# Patient Record
Sex: Male | Born: 1990 | Hispanic: Yes
Health system: Southern US, Community
[De-identification: ages and names within clinical notes are randomized; demographics above are authoritative.]

## PROBLEM LIST (undated history)

## (undated) HISTORY — PX: KNEE SURGERY: SHX244

---

## 2020-03-24 ENCOUNTER — Other Ambulatory Visit: Payer: Self-pay

## 2020-03-24 ENCOUNTER — Ambulatory Visit (HOSPITAL_COMMUNITY)
Admission: RE | Admit: 2020-03-24 | Discharge: 2020-03-24 | Disposition: A | Discharge status: Home / Self Care | Payer: Self-pay | Source: Ambulatory Visit | Attending: Orthopedic Surgery | Admitting: Orthopedic Surgery

## 2020-03-24 ENCOUNTER — Other Ambulatory Visit (HOSPITAL_COMMUNITY): Payer: Self-pay | Admitting: Orthopedic Surgery

## 2020-03-24 DIAGNOSIS — Z9889 Other specified postprocedural states: Secondary | ICD-10-CM

## 2020-03-24 DIAGNOSIS — M79662 Pain in left lower leg: Secondary | ICD-10-CM

## 2020-03-24 DIAGNOSIS — M7989 Other specified soft tissue disorders: Secondary | ICD-10-CM | POA: Insufficient documentation

## 2020-12-22 ENCOUNTER — Emergency Department (HOSPITAL_COMMUNITY): Payer: Self-pay

## 2020-12-22 ENCOUNTER — Other Ambulatory Visit: Payer: Self-pay

## 2020-12-22 ENCOUNTER — Encounter (HOSPITAL_COMMUNITY): Payer: Self-pay | Admitting: *Deleted

## 2020-12-22 ENCOUNTER — Emergency Department (HOSPITAL_COMMUNITY)
Admission: EM | Admit: 2020-12-22 | Discharge: 2020-12-22 | Disposition: A | Discharge status: Home / Self Care | Payer: Self-pay | Attending: Emergency Medicine | Admitting: Emergency Medicine

## 2020-12-22 DIAGNOSIS — W19XXXA Unspecified fall, initial encounter: Secondary | ICD-10-CM

## 2020-12-22 DIAGNOSIS — M25512 Pain in left shoulder: Secondary | ICD-10-CM | POA: Insufficient documentation

## 2020-12-22 DIAGNOSIS — W12XXXA Fall on and from scaffolding, initial encounter: Secondary | ICD-10-CM | POA: Insufficient documentation

## 2020-12-22 DIAGNOSIS — M25522 Pain in left elbow: Secondary | ICD-10-CM | POA: Insufficient documentation

## 2020-12-22 DIAGNOSIS — S32019A Unspecified fracture of first lumbar vertebra, initial encounter for closed fracture: Secondary | ICD-10-CM | POA: Insufficient documentation

## 2020-12-22 MED ORDER — ACETAMINOPHEN 500 MG PO TABS
1000.0000 mg | ORAL_TABLET | Freq: Once | ORAL | Status: AC
  Administered 2020-12-22: 1000 mg via ORAL
  Filled 2020-12-22: qty 2

## 2020-12-22 NOTE — ED Notes (Signed)
Called ortho to apply TLSO

## 2020-12-22 NOTE — ED Triage Notes (Signed)
Pt fell off scafolding on his sacrum from 13 feet.  He landed on concrete No loc.  Pt did not hit head.  Pt was able to ambulate to car.  Denies numbness or tingling in LE.  No loss of bladder control.

## 2020-12-22 NOTE — ED Notes (Signed)
Discharge instructions reviewed and explained, pt verbalized understanding.  ?

## 2020-12-22 NOTE — Consult Note (Signed)
Neurosurgery Consultation  Reason for Consult: Closed fracture of the lumbar spine Referring Physician: Ralene Bathe  CC: fall from height, 22f  HPI: This is a 30y.o. man that presents after a fall off scaffolding of 13 feet. He has low back and left shoulder pain. The back pain is sharp in quality, moderate in intensity, worse with activity, improved with rest. No radiation into the legs, no new weakness, numbness, or parasthesias, no change in bowel or bladder function.    ROS: A 14 point ROS was performed and is negative except as noted in the HPI.   PMHx: History reviewed. No pertinent past medical history. FamHx: No family history on file. SocHx:  has no history on file for tobacco use, alcohol use, and drug use.  Exam: Vital signs in last 24 hours: Temp:  [98.5 F (36.9 C)] 98.5 F (36.9 C) (05/20 1111) Pulse Rate:  [63-75] 63 (05/20 1303) Resp:  [18] 18 (05/20 1303) BP: (112-119)/(54-71) 119/54 (05/20 1303) SpO2:  [99 %-100 %] 100 % (05/20 1303) Weight:  [86.2 kg] 86.2 kg (05/20 1128) General: Awake, alert, cooperative, lying in bed in NAD Head: Normocephalic and atruamatic HEENT: Neck supple Pulmonary: breathing room air comfortably, no evidence of increased work of breathing Cardiac: RRR Abdomen: S NT ND Extremities: Warm and well perfused x4 Neuro: AOx3, PERRL, EOMI, FS Strength 5/5 x4, SILTx4   Assessment and Plan: 30y.o. man s/p fall from height, XR L-spine with likely compression frx of L1 VB. CT L-spine personally reviewed, which shows L1 compression fracture involving only the superior endplate with roughly 260%LOH, no retropulsion.   -no acute neurosurgical intervention indicated at this time -TLSO brace, follow up with me in clinic in 3-4 weeks -please call with any concerns or questions  TJudith Part MD 12/22/20 1:50 PM [facility]

## 2020-12-22 NOTE — Progress Notes (Signed)
Orthopedic Tech Progress Note Patient Details:  Casey Stewart February 25, 1991 [id number]  Ortho Devices Type of Ortho Device: Lumbar corsett Ortho Device/Splint Interventions: Ordered,Application   Post Interventions Patient Tolerated: Well Instructions Provided: Adjustment of device   Maurene Capes 12/22/2020, 4:46 PM

## 2020-12-22 NOTE — ED Notes (Signed)
Neuro surgery at bedside.

## 2020-12-22 NOTE — ED Provider Notes (Signed)
Emergency Medicine Provider Triage Evaluation Note  Casey Stewart , a 30 y.o. male  was evaluated in triage.  Pt complains of left shoulder and low back pain after a fall.  Patient states he fell off scaffolding, about 13 feet.  He did not hit his head or lose consciousness.  He landed on his buttock, and fell to the L side.  This occurred just prior to arrival.  He is not on blood thinners.  He has not taken anything for pain.  He reports pain in his left shoulder in his low back.  He has been ambulatory since.  No numbness or tingling.  No loss of bowel or bladder control.  Review of Systems  Positive: Back pain, L shoulder pain Negative: Numbness, loss of bowel or bladder  Physical Exam  BP 112/71 (BP Location: Right Arm)   Pulse 75   Temp 98.5 F (36.9 C) (Oral)   Resp 18   Ht 6\' 2"  (1.88 m)   Wt 86.2 kg   SpO2 99%   BMI 24.39 kg/m  Gen:   Awake, no distress   Resp:  Normal effort  MSK:   TTP of left shoulder/scapula.  Limited range of motion due to pain.  Mild tenderness palpation of the left elbow with superficial abrasions.  Full active range of motion of the elbow.  Tenderness palpation of midline lumbar and sacral spine without step-offs.  No tenderness palpation of bilateral low back musculature.  No pain over the hips.  No saddle anesthesia.  Strength and sensation of lower extremities equal bilaterally.   Medical Decision Making  Medically screening exam initiated at 11:33 AM.  Appropriate orders placed.  Trace Wirick was informed that the remainder of the evaluation will be completed by another provider, this initial triage assessment does not replace that evaluation, and the importance of remaining in the ED until their evaluation is complete.  xrays and tylenol ordered   Agapito Games, PA-C 12/22/20 1137    12/24/20, MD 12/22/20 [phone]

## 2020-12-22 NOTE — ED Provider Notes (Signed)
[facility] EMERGENCY DEPARTMENT Provider Note   CSN: [id number] Arrival date & time: 12/22/20  1016     History Chief Complaint  Patient presents with  . Fall    Casey Stewart is a 30 y.o. male with past medical history significant for DVT after left knee surgery.  Immunizations UTD.  HPI Patient presents to emergency department today with chief complaint of fall happening just prior to arrival.  Patient was at work and fell off scaffolding height of 13 feet.  He landed on his sacrum on concrete and then fell backwards.  He denies hitting his head or loss of consciousness. He was able to get up and walk to his car after the fall. He is endorsing pain in his left shoulder and elbow and low back. He rates pain 6/10 in severity.  He denies loss of bowel or bladder.  Denies any numbness, weakness or tingling.  No medication for symptoms prior to arrival.   Patient states he supposed to be on a blood thinner.  After he had knee surgery he developed left extremity DVT.  He states he took the blood thinner for 2 months and then discontinued it on his own.  He does not remember what the name of it was.   History reviewed. No pertinent past medical history.  There are no problems to display for this patient.       No family history on file.     Home Medications Prior to Admission medications   Not on File    Allergies    Anesthesia [id number]-i-40 [propofol]  Review of Systems   Review of Systems All other systems are reviewed and are negative for acute change except as noted in the HPI.  Physical Exam Updated Vital Signs BP 112/71 (BP Location: Right Arm)   Pulse 75   Temp 98.5 F (36.9 C) (Oral)   Resp 18   Ht 6\' 2"  (1.88 m)   Wt 86.2 kg   SpO2 99%   BMI 24.39 kg/m   Physical Exam Vitals and nursing note reviewed.  Constitutional:      General: He is not in acute distress.    Appearance: He is not ill-appearing.  HENT:     Head: Normocephalic and  atraumatic.     Comments: No tenderness to palpation of skull. No deformities or crepitus noted. No open wounds, abrasions or lacerations.    Right Ear: Tympanic membrane and external ear normal.     Left Ear: Tympanic membrane and external ear normal.     Nose: Nose normal.     Mouth/Throat:     Mouth: Mucous membranes are moist.     Pharynx: Oropharynx is clear.  Eyes:     General: No scleral icterus.       Right eye: No discharge.        Left eye: No discharge.     Extraocular Movements: Extraocular movements intact.     Conjunctiva/sclera: Conjunctivae normal.     Pupils: Pupils are equal, round, and reactive to light.  Neck:     Vascular: No JVD.  Cardiovascular:     Rate and Rhythm: Normal rate and regular rhythm.     Pulses: Normal pulses.          Radial pulses are 2+ on the right side and 2+ on the left side.       Dorsalis pedis pulses are 2+ on the right side and 2+ on the left side.  Heart sounds: Normal heart sounds.  Pulmonary:     Comments: Lungs clear to auscultation in all fields. Symmetric chest rise. No wheezing, rales, or rhonchi. Abdominal:     Comments: Abdomen is soft, non-distended, and non-tender in all quadrants. No rigidity, no guarding. No peritoneal signs.  Musculoskeletal:        General: Normal range of motion.     Cervical back: Normal range of motion.       Back:     Comments: Moving all extremities spontaneously.   Tenderness of lumbar spine as depicted in image above.  No crepitus or deformity.  No overlying skin changes.  Pelvis is stable.  Skin:    General: Skin is warm and dry.     Capillary Refill: Capillary refill takes less than 2 seconds.     Comments: Equal tactile temperature in all extremities.  Abrasions to left inner upper extremity.  No deep open wounds or active bleeding.  Neurological:     Mental Status: He is oriented to person, place, and time.     GCS: GCS eye subscore is 4. GCS verbal subscore is 5. GCS motor subscore  is 6.     Comments: Fluent speech, no facial droop.  Sensation grossly intact to light touch in the lower extremities bilaterally. No saddle anesthesias. Strength 5/5 with flexion and extension at the bilateral hips, knees, and ankles. No noted gait deficit. Coordination intact with heel to shin testing.  Psychiatric:        Behavior: Behavior normal.     ED Results / Procedures / Treatments   Labs (all labs ordered are listed, but only abnormal results are displayed) Labs Reviewed - No data to display  EKG None  Radiology DG Chest 2 View  Result Date: 12/22/2020 CLINICAL DATA:  Post fall from scaffolding about 13 feet. EXAM: CHEST - 2 VIEW COMPARISON:  None. FINDINGS: Normal cardiac silhouette and mediastinal contours. No focal parenchymal opacities. No pleural effusion or pneumothorax. No evidence of edema. No acute osseous abnormalities. IMPRESSION: No acute cardiopulmonary disease. Electronically Signed   By: Simonne ComeJohn  Watts M.D.   On: 12/22/2020 12:21   DG Lumbar Spine Complete  Result Date: 12/22/2020 CLINICAL DATA:  Post fall from scaffolding about 13 feet. EXAM: LUMBAR SPINE - COMPLETE 4+ VIEW COMPARISON:  None. FINDINGS: There are 5 non rib-bearing lumbar type vertebral bodies. No significant scoliotic curvature. No anterolisthesis or retrolisthesis. Bilateral facets appear normally aligned. No definite pars defects. Mild (approximately 25%) presumably acute compression fracture involving the superior endplate of the L1 vertebral body. No definitive retropulsion. Remaining lumbar vertebral body heights appear preserved. Lumbar intervertebral disc space heights appear preserved. Limited visualization of the bilateral SI joints and hips is normal. Moderate colonic stool burden without evidence of enteric obstruction. Punctate phlebolith overlies the left hemipelvis. No radiopaque foreign body. IMPRESSION: Mild (approximately 25%) presumably acute compression fracture involving the  superior endplate of the L1 vertebral body without definitive retropulsion. Correlation for point tenderness at this location is advised. Electronically Signed   By: Simonne ComeJohn  Watts M.D.   On: 12/22/2020 12:24   DG Pelvis 1-2 Views  Result Date: 12/22/2020 CLINICAL DATA:  Post fall 13 feet from scaffolding. EXAM: PELVIS - 1-2 VIEW COMPARISON:  None. FINDINGS: No fracture or dislocation. Limited visualization of the bilateral SI joints, hips and pubic symphysis is normal. Limited visualization of lower lumbar spine is normal. Punctate phlebolith overlies the left hemipelvis. Regional soft tissues appear normal. IMPRESSION: No displaced pelvic fracture.  Electronically Signed   By: Simonne Come M.D.   On: 12/22/2020 12:28   DG Elbow Complete Left  Result Date: 12/22/2020 CLINICAL DATA:  Post fall from scaffolding about 13 feet. EXAM: LEFT ELBOW - COMPLETE 3+ VIEW COMPARISON:  None. FINDINGS: No fracture or elbow joint effusion. Joint spaces are preserved. Regional soft tissues appear normal. No radiopaque foreign body. IMPRESSION: No fracture or elbow joint effusion. Electronically Signed   By: Simonne Come M.D.   On: 12/22/2020 12:22   CT Lumbar Spine Wo Contrast  Result Date: 12/22/2020 CLINICAL DATA:  Larey Seat from scaffolding today with lumbar fracture EXAM: CT LUMBAR SPINE WITHOUT CONTRAST TECHNIQUE: Multidetector CT imaging of the lumbar spine was performed without intravenous contrast administration. Multiplanar CT image reconstructions were also generated. COMPARISON:  Radiography same day FINDINGS: Segmentation: 5 lumbar type vertebral bodies. Alignment: Normal Vertebrae: Superior endplate compression fracture at L1 with approximately 20% loss of height anteriorly. No retropulsed bone. No posterior element involvement. No other regional fracture. Paraspinal and other soft tissues: Mild edema adjacent to the L1 fracture. Otherwise negative. Disc levels: No disc level pathology seen. No evidence of traumatic  disc herniation. No stenosis of the canal or foramina. No facet arthropathy. Sacroiliac joints appear normal. IMPRESSION: Superior endplate compression fracture at L1 with approximately 20% loss of height anteriorly. No retropulsion. No posterior element involvement. No sign of traumatic disc herniation. Electronically Signed   By: Paulina Fusi M.D.   On: 12/22/2020 14:46   DG Shoulder Left  Result Date: 12/22/2020 CLINICAL DATA:  Post fall from scaffolding about 13 feet, now with left shoulder pain. EXAM: LEFT SHOULDER - 2+ VIEW COMPARISON:  Left elbow radiographs-earlier same day FINDINGS: No fracture or dislocation. Glenohumeral and acromioclavicular joint spaces appear preserved. No evidence of calcific tendinitis. Limited visualization of the adjacent thorax is normal. Regional soft tissues appear normal. No radiopaque foreign body. IMPRESSION: No fracture or dislocation. Electronically Signed   By: Simonne Come M.D.   On: 12/22/2020 12:20    Procedures Procedures   Medications Ordered in ED Medications  acetaminophen (TYLENOL) tablet 1,000 mg (1,000 mg Oral Given 12/22/20 1310)    ED Course  I have reviewed the triage vital signs and the nursing notes.  Pertinent labs & imaging results that were available during my care of the patient were reviewed by me and considered in my medical decision making (see chart for details).    MDM Rules/Calculators/A&P                          History provided by patient with additional history obtained from chart review.    Presenting after fall from 13 feet.  Patient seen in triage and work-up initiated with x-rays of left shoulder, elbow, chest pelvis and lumbar spine.  On exam patient is well-appearing.  He is moving all extremities.  He has tenderness palpation of lumbar spinous processes.  There is no step-off or deformity.  His pelvis is stable he is amatory with normal gait.  No saddle anesthesia.  Patient voided in the urinal and has no gross  hematuria. Patient given tylenol for pain. He declines need for anything stronger. I viewed all images.  X-ray of left shoulder and elbow as well as chest and pelvis are all unremarkable. Xray lumbar spine shows mild (approximately 25%) presumably acute compression fracture involving the superior endplate of the L1 vertebral body without definitive retropulsion. Consulted on call neurosurgeon Dr. Maurice Small who  recommends lumbar spine to look for further injury.  CT lumbar spine was obtained and shows superior endplate compression fracture at L1 with approximately 20% loss of height anteriorly. No retropulsion.  Patient seen by Dr. Johnsie Cancel at the bedside as well.  He agrees with plan for TLSO brace and discharged home.  I discussed pain management with patient at home.  He has leftover prescription for pain medicine from his knee surgery.  He plans to take Tylenol for pain at home and can take the pain medicine for breakthrough pain.  Patient will follow-up outpatient with Dr. Maurice Small.  Strict return precautions discussed.  Findings and plan of care discussed with supervising physician Dr. Madilyn Hook who agrees with plan of care.    Portions of this note were generated with Scientist, clinical (histocompatibility and immunogenetics). Dictation errors may occur despite best attempts at proofreading.  Final Clinical Impression(s) / ED Diagnoses Final diagnoses:  Fall, initial encounter  Closed fracture of first lumbar vertebra, unspecified fracture morphology, initial encounter [facility])    Rx / DC Orders ED Discharge Orders    None       Kandice Hams 12/22/20 1558    Tilden Fossa, MD 12/23/20 1454

## 2020-12-22 NOTE — Discharge Instructions (Signed)
CT scan shows superior endplate compression fracture at L1 with approximately 20% loss of height anteriorly. No retropulsion.  Wear the brace for comfort.  Follow-up with Dr. Maurice Small in office.  Return to ER for any new or worsening symptoms.

## 2021-08-18 IMAGING — CR DG LUMBAR SPINE COMPLETE 4+V
5 series · 5 of 5 positions shown · non-contrast
Comparison: None.

CLINICAL DATA: Post fall from scaffolding about 13 feet.

EXAM:
LUMBAR SPINE - COMPLETE 4+ VIEW

[l-spine ap]
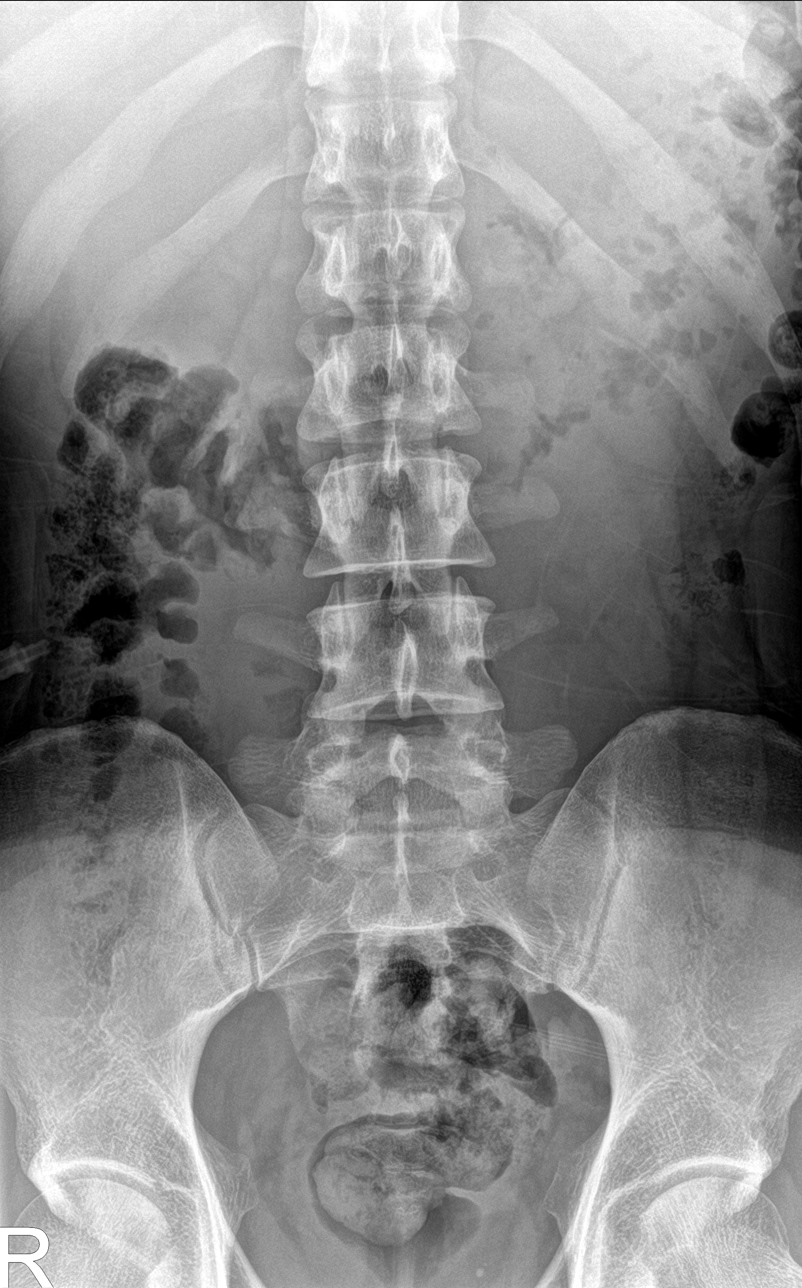

[l-spine obl (1 of 2)]
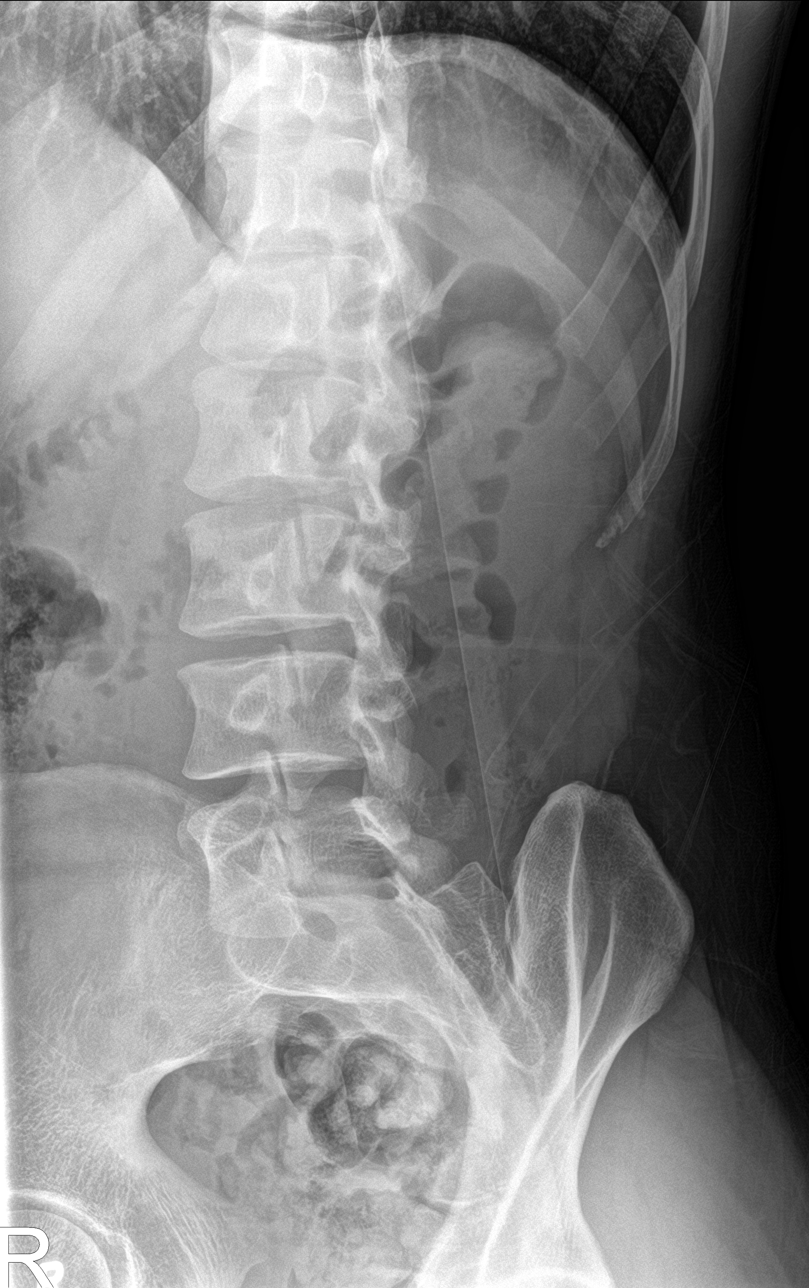

[l-spine obl (2 of 2)]
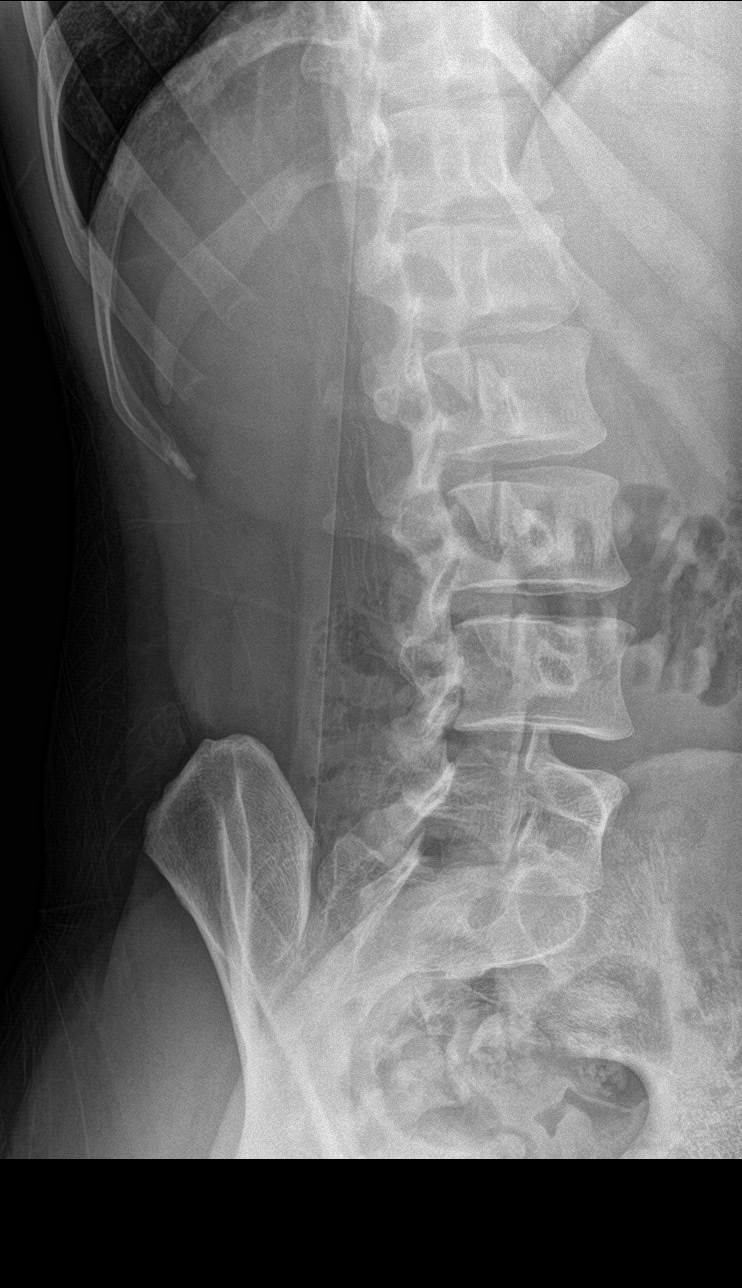

[l-spine lat]
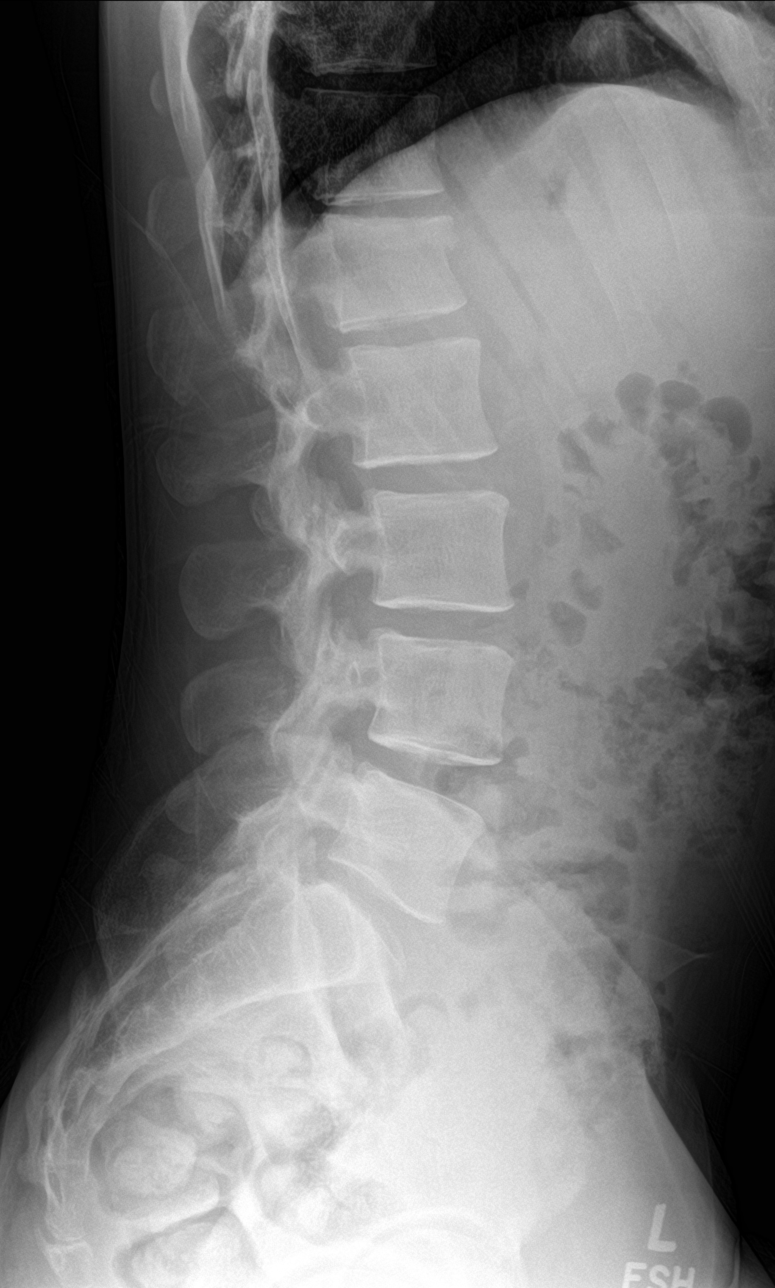

[l-spine spot]
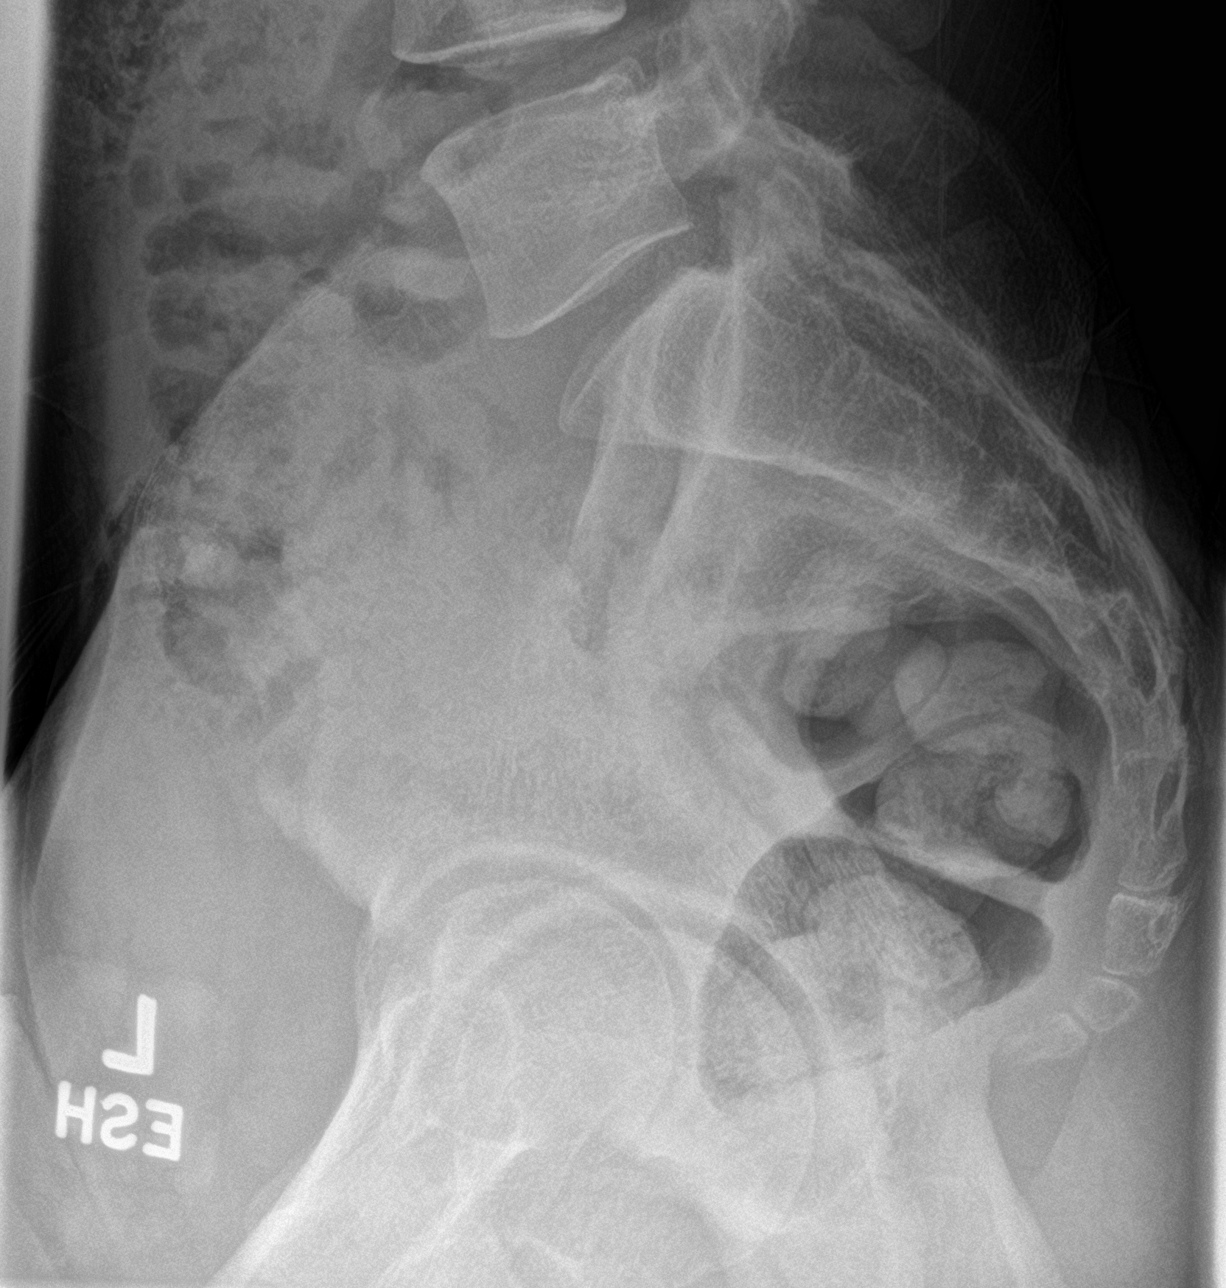

[5 of 5 positions shown; findings below may reference images not displayed]

FINDINGS: There are 5 non rib-bearing lumbar type vertebral bodies.

No significant scoliotic curvature. No anterolisthesis or
retrolisthesis. Bilateral facets appear normally aligned. No
definite pars defects.

Mild (approximately 25%) presumably acute compression fracture
involving the superior endplate of the L1 vertebral body. No
definitive retropulsion. Remaining lumbar vertebral body heights
appear preserved.

Lumbar intervertebral disc space heights appear preserved.

Limited visualization of the bilateral SI joints and hips is normal.

Moderate colonic stool burden without evidence of enteric
obstruction. Punctate phlebolith overlies the left hemipelvis. No
radiopaque foreign body.
IMPRESSION: Mild (approximately 25%) presumably acute compression fracture
involving the superior endplate of the L1 vertebral body without
definitive retropulsion. Correlation for point tenderness at this
location is advised.

## 2021-08-18 IMAGING — CR DG ELBOW COMPLETE 3+V*L*
4 series · 4 of 4 positions shown · non-contrast
Comparison: None.

CLINICAL DATA: Post fall from scaffolding about 13 feet.

EXAM:
LEFT ELBOW - COMPLETE 3+ VIEW

[elbow ap]
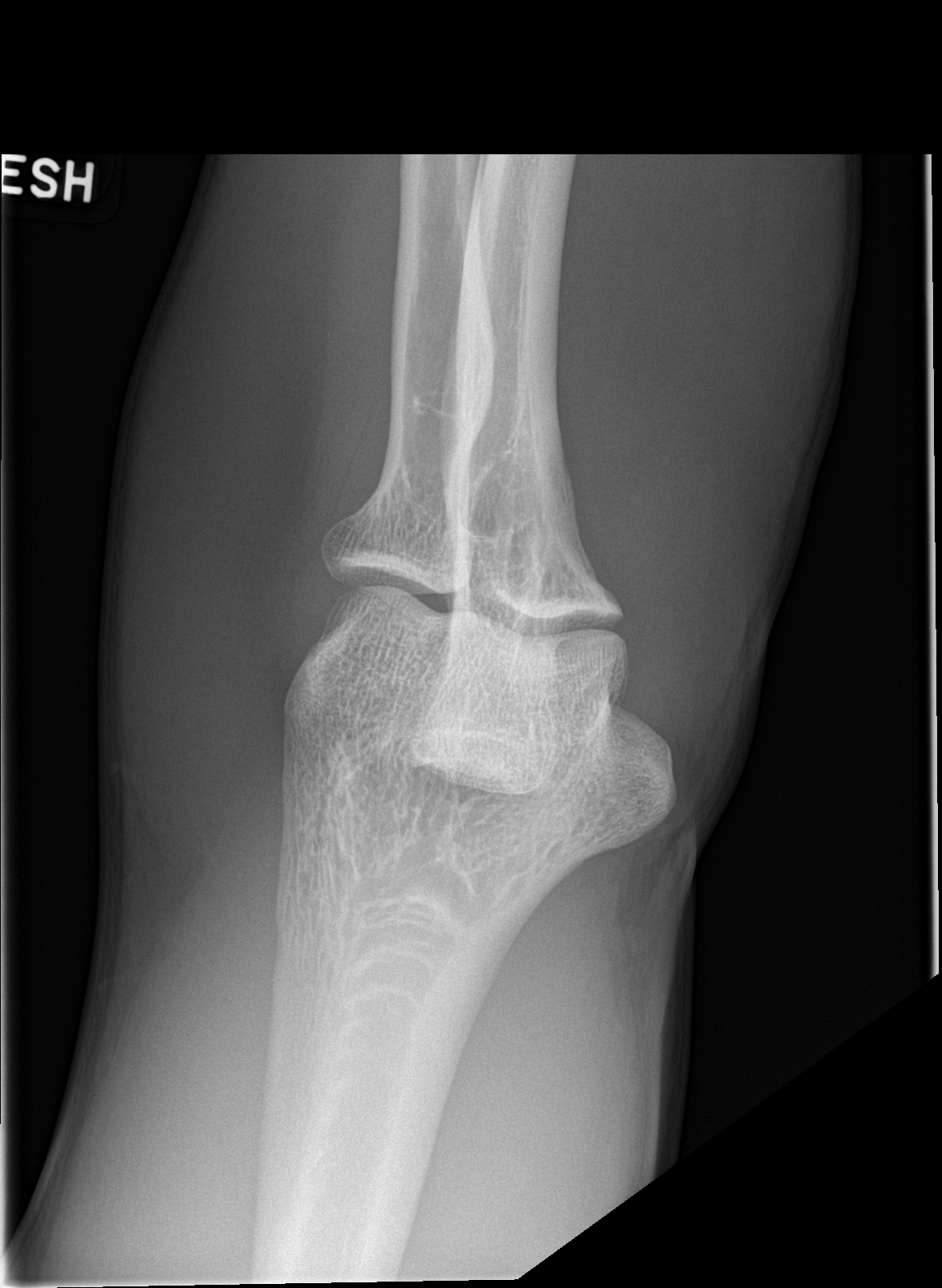

[elbow obl (1 of 2)]
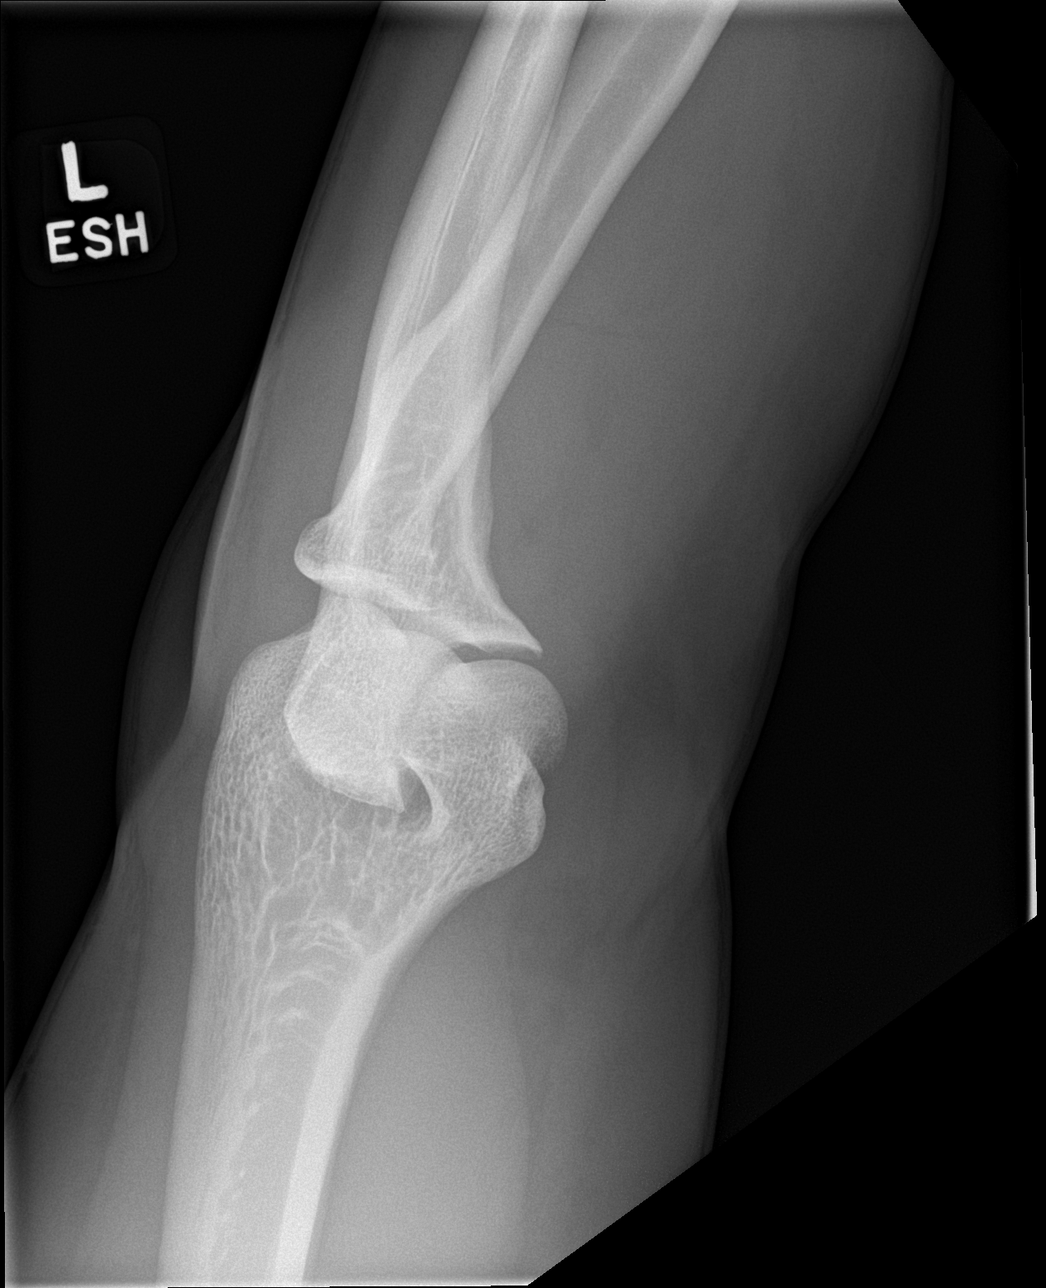

[elbow obl (2 of 2)]
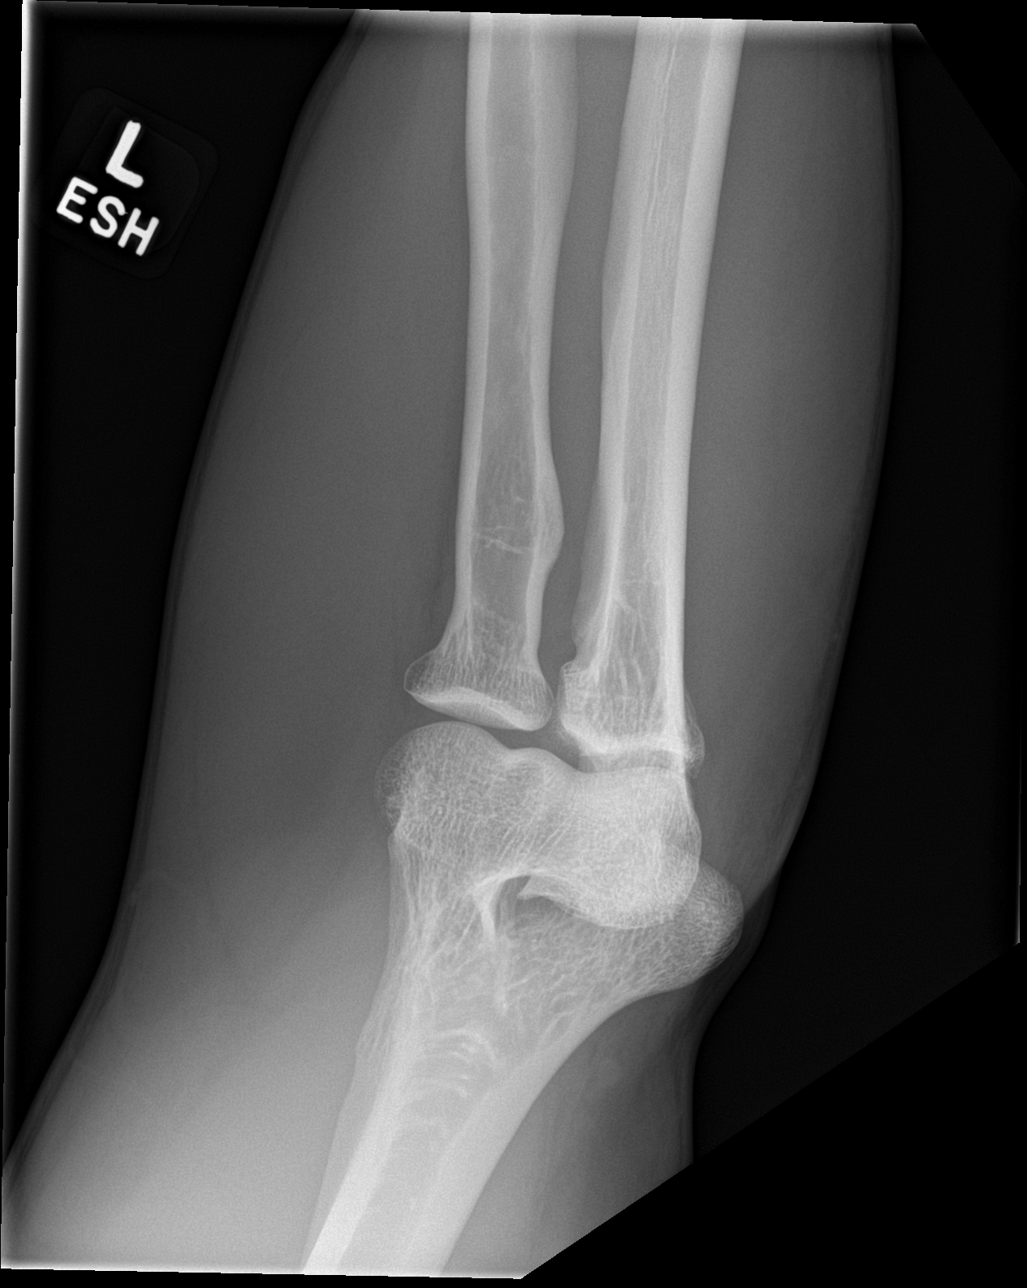

[elbow lat]
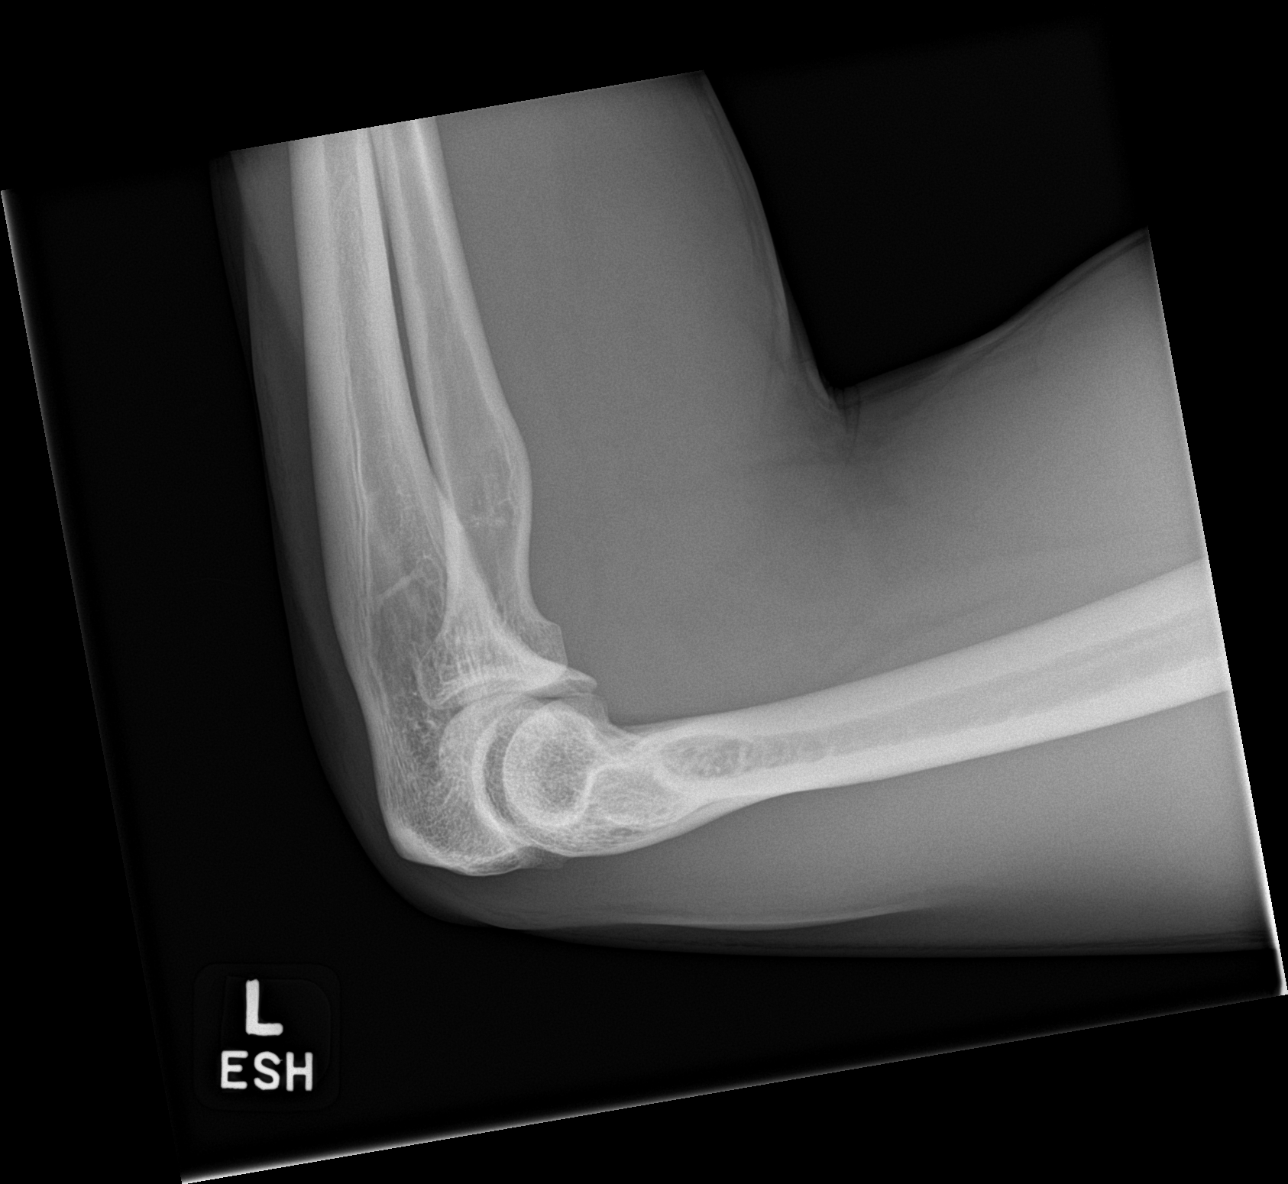

[4 of 4 positions shown; findings below may reference images not displayed]

FINDINGS: No fracture or elbow joint effusion. Joint spaces are preserved.
Regional soft tissues appear normal. No radiopaque foreign body.
IMPRESSION: No fracture or elbow joint effusion.

## 2021-08-18 IMAGING — CR DG SHOULDER 2+V*L*
3 series · 3 of 3 positions shown · non-contrast
Comparison: Left elbow radiographs-earlier same day

CLINICAL DATA: Post fall from scaffolding about 13 feet, now with
left shoulder pain.

EXAM:
LEFT SHOULDER - 2+ VIEW

[shoulder grashey]
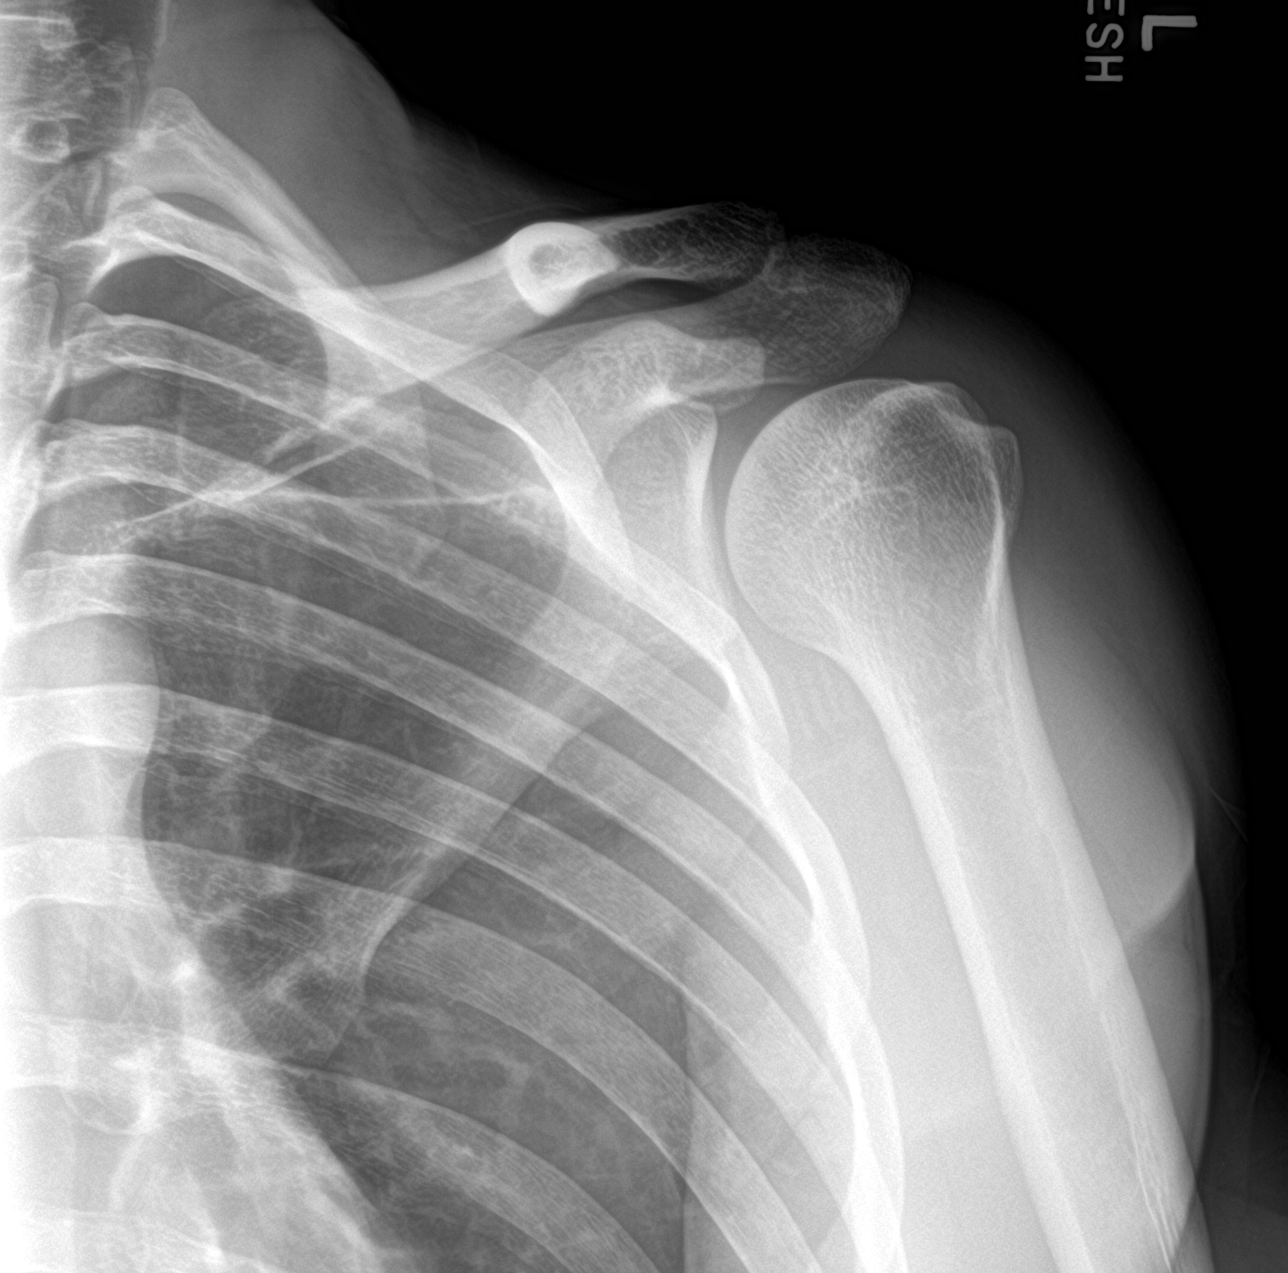

[shoulder y view]
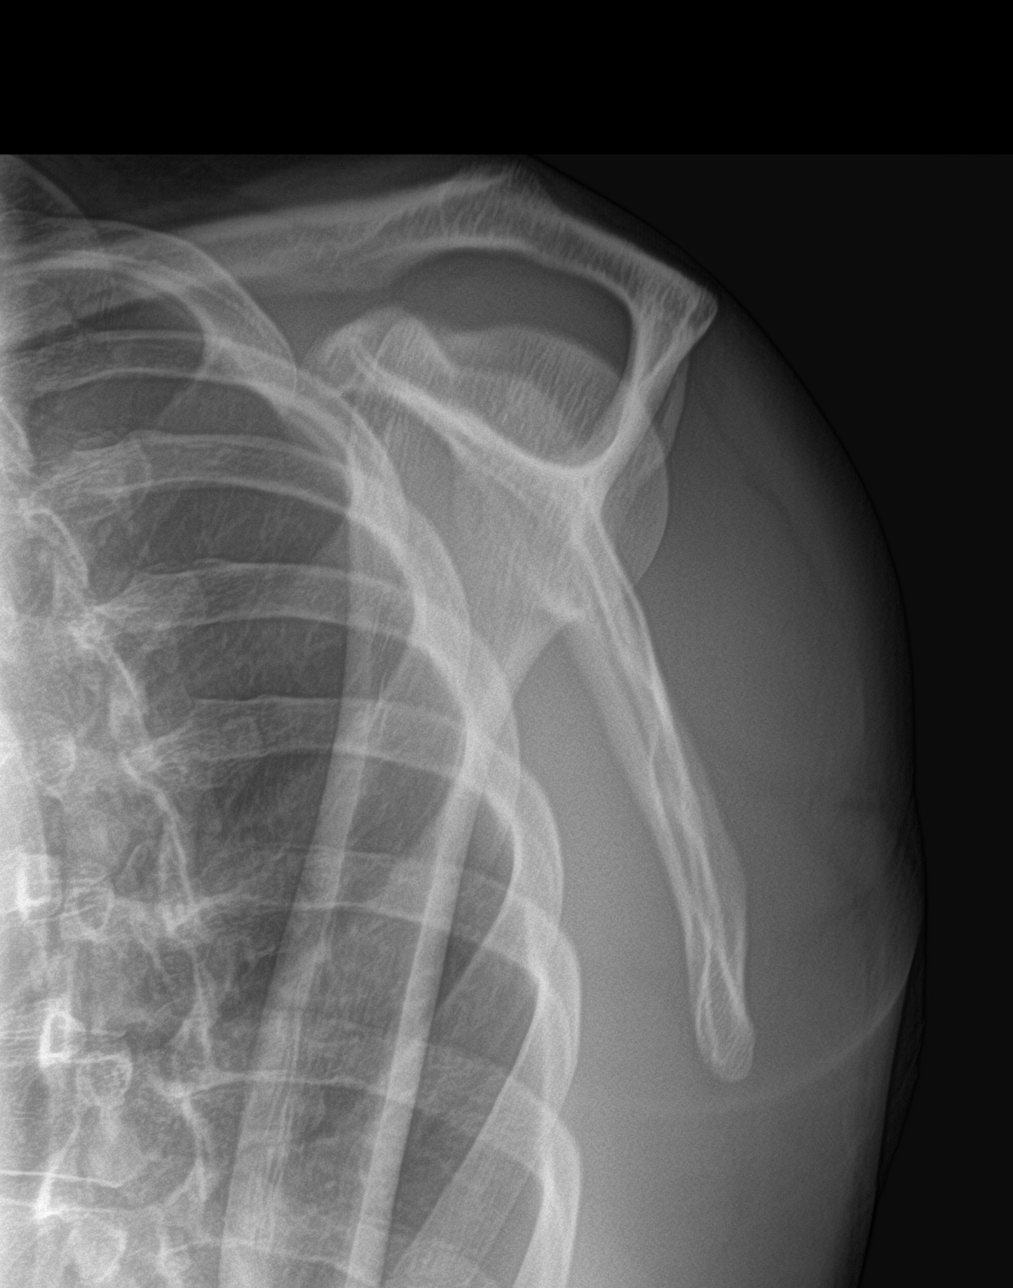

[shoulder axillary]
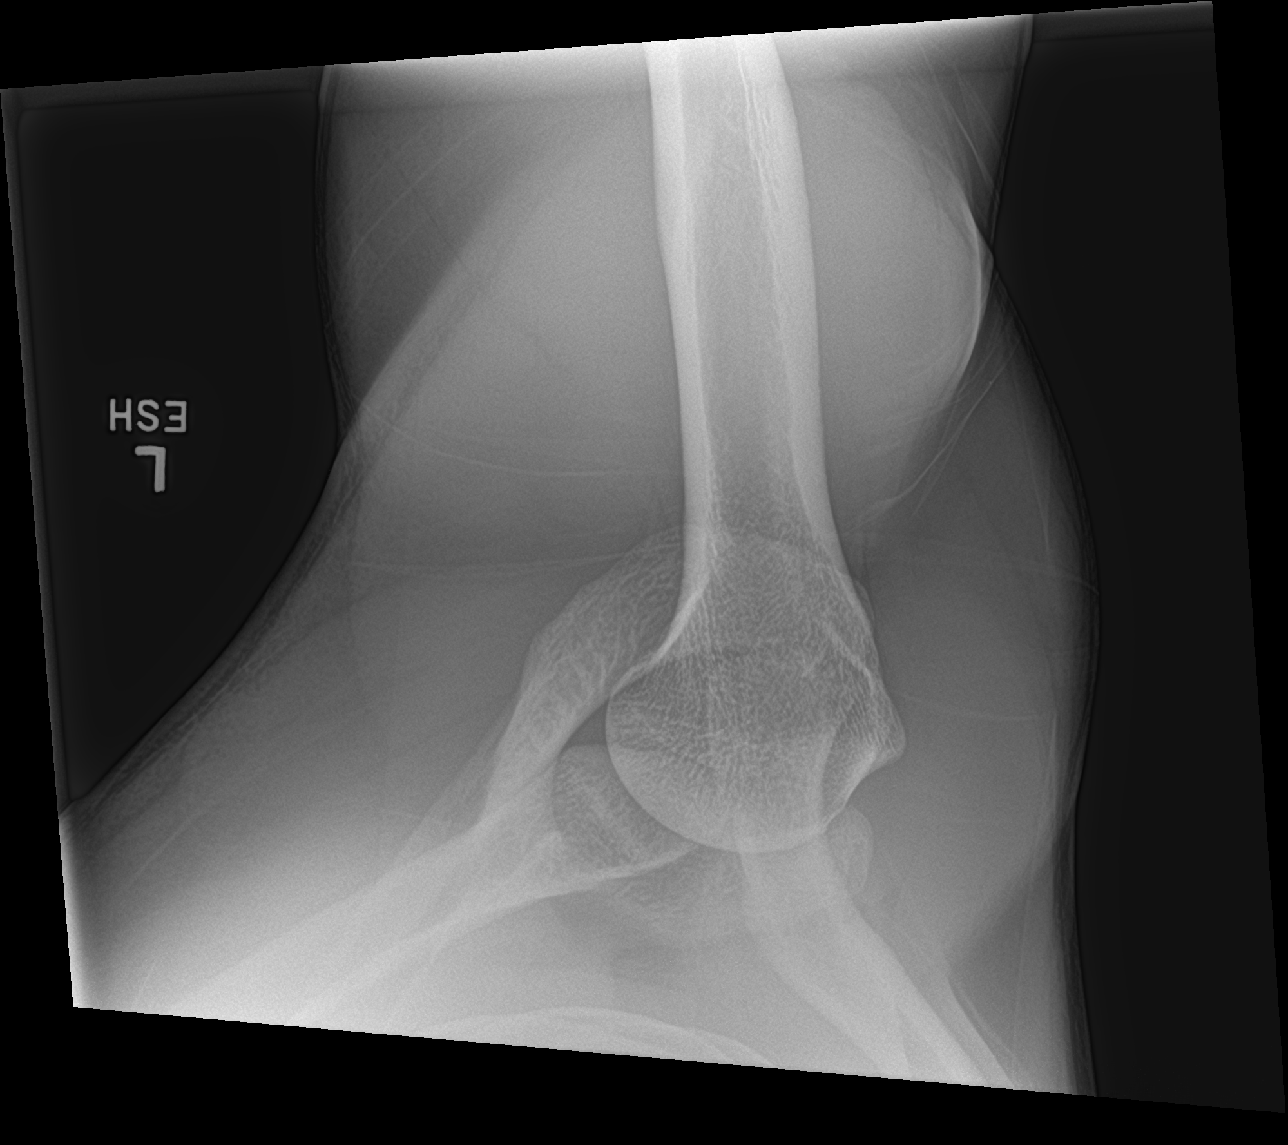

[3 of 3 positions shown; findings below may reference images not displayed]

FINDINGS: No fracture or dislocation. Glenohumeral and acromioclavicular joint
spaces appear preserved. No evidence of calcific tendinitis. Limited
visualization of the adjacent thorax is normal. Regional soft
tissues appear normal. No radiopaque foreign body.
IMPRESSION: No fracture or dislocation.
